# Patient Record
Sex: Male | Born: 1945 | Race: White | Hispanic: No | Marital: Single | Smoking: Former smoker
Health system: Southern US, Community
[De-identification: ages and names within clinical notes are randomized; demographics above are authoritative.]

---

## 2018-04-13 ENCOUNTER — Emergency Department (HOSPITAL_COMMUNITY)
Admission: EM | Admit: 2018-04-13 | Discharge: 2018-04-13 | Disposition: A | Discharge status: Home / Self Care | Payer: Medicare Other | Attending: Emergency Medicine | Admitting: Emergency Medicine

## 2018-04-13 ENCOUNTER — Other Ambulatory Visit: Payer: Self-pay

## 2018-04-13 ENCOUNTER — Encounter (HOSPITAL_COMMUNITY): Payer: Self-pay | Admitting: Emergency Medicine

## 2018-04-13 ENCOUNTER — Emergency Department (HOSPITAL_COMMUNITY): Payer: Medicare Other

## 2018-04-13 DIAGNOSIS — Z7982 Long term (current) use of aspirin: Secondary | ICD-10-CM | POA: Diagnosis not present

## 2018-04-13 DIAGNOSIS — R109 Unspecified abdominal pain: Secondary | ICD-10-CM | POA: Diagnosis not present

## 2018-04-13 DIAGNOSIS — Z87891 Personal history of nicotine dependence: Secondary | ICD-10-CM | POA: Diagnosis not present

## 2018-04-13 DIAGNOSIS — Z79899 Other long term (current) drug therapy: Secondary | ICD-10-CM | POA: Diagnosis not present

## 2018-04-13 DIAGNOSIS — R197 Diarrhea, unspecified: Secondary | ICD-10-CM | POA: Insufficient documentation

## 2018-04-13 DIAGNOSIS — E860 Dehydration: Secondary | ICD-10-CM

## 2018-04-13 DIAGNOSIS — Z7902 Long term (current) use of antithrombotics/antiplatelets: Secondary | ICD-10-CM | POA: Diagnosis not present

## 2018-04-13 DIAGNOSIS — R112 Nausea with vomiting, unspecified: Secondary | ICD-10-CM

## 2018-04-13 LAB — URINALYSIS, ROUTINE W REFLEX MICROSCOPIC
BILIRUBIN URINE: NEGATIVE
Glucose, UA: NEGATIVE mg/dL
Ketones, ur: NEGATIVE mg/dL
Leukocytes, UA: NEGATIVE
Nitrite: NEGATIVE
PH: 5 (ref 5.0–8.0)
Protein, ur: 30 mg/dL — AB
SPECIFIC GRAVITY, URINE: 1.021 (ref 1.005–1.030)

## 2018-04-13 LAB — COMPREHENSIVE METABOLIC PANEL
ALK PHOS: 51 U/L (ref 38–126)
ALT: 14 U/L (ref 0–44)
AST: 16 U/L (ref 15–41)
Albumin: 3.9 g/dL (ref 3.5–5.0)
Anion gap: 12 (ref 5–15)
BUN: 20 mg/dL (ref 8–23)
CALCIUM: 9.1 mg/dL (ref 8.9–10.3)
CHLORIDE: 96 mmol/L — AB (ref 98–111)
CO2: 32 mmol/L (ref 22–32)
Creatinine, Ser: 0.94 mg/dL (ref 0.61–1.24)
Glucose, Bld: 111 mg/dL — ABNORMAL HIGH (ref 70–99)
Potassium: 3.6 mmol/L (ref 3.5–5.1)
SODIUM: 140 mmol/L (ref 135–145)
Total Bilirubin: 1.2 mg/dL (ref 0.3–1.2)
Total Protein: 6.6 g/dL (ref 6.5–8.1)

## 2018-04-13 LAB — CBC
HCT: 46.4 % (ref 39.0–52.0)
Hemoglobin: 14.5 g/dL (ref 13.0–17.0)
MCH: 28.3 pg (ref 26.0–34.0)
MCHC: 31.3 g/dL (ref 30.0–36.0)
MCV: 90.6 fL (ref 78.0–100.0)
PLATELETS: 142 10*3/uL — AB (ref 150–400)
RBC: 5.12 MIL/uL (ref 4.22–5.81)
RDW: 15.5 % (ref 11.5–15.5)
WBC: 9 10*3/uL (ref 4.0–10.5)

## 2018-04-13 LAB — LIPASE, BLOOD: LIPASE: 26 U/L (ref 11–51)

## 2018-04-13 MED ORDER — SODIUM CHLORIDE 0.9 % IV BOLUS
1000.0000 mL | Freq: Once | INTRAVENOUS | Status: AC
  Administered 2018-04-13: 1000 mL via INTRAVENOUS

## 2018-04-13 MED ORDER — LOPERAMIDE HCL 2 MG PO CAPS: 2.0000 mg | ORAL_CAPSULE | Freq: Four times a day (QID) | ORAL | 0 refills | Status: AC | PRN

## 2018-04-13 MED ORDER — SODIUM CHLORIDE 0.9 % IV BOLUS: 1000.0000 mL | Freq: Once | INTRAVENOUS | Status: DC

## 2018-04-13 MED ORDER — DICYCLOMINE HCL 20 MG PO TABS: 20.0000 mg | ORAL_TABLET | Freq: Three times a day (TID) | ORAL | 0 refills | Status: AC

## 2018-04-13 MED ORDER — ONDANSETRON 4 MG PO TBDP: 4.0000 mg | ORAL_TABLET | Freq: Three times a day (TID) | ORAL | 0 refills | Status: AC | PRN

## 2018-04-13 MED ORDER — MORPHINE SULFATE (PF) 4 MG/ML IV SOLN
4.0000 mg | Freq: Once | INTRAVENOUS | Status: DC
  Filled 2018-04-13: qty 1

## 2018-04-13 MED ORDER — IOHEXOL 300 MG/ML  SOLN
100.0000 mL | Freq: Once | INTRAMUSCULAR | Status: AC | PRN
  Administered 2018-04-13: 100 mL via INTRAVENOUS

## 2018-04-13 MED ORDER — ONDANSETRON HCL 4 MG/2ML IJ SOLN
4.0000 mg | Freq: Once | INTRAMUSCULAR | Status: AC
  Administered 2018-04-13: 4 mg via INTRAVENOUS
  Filled 2018-04-13: qty 2

## 2018-04-13 MED ORDER — DICYCLOMINE HCL 10 MG PO CAPS
20.0000 mg | ORAL_CAPSULE | Freq: Once | ORAL | Status: AC
  Administered 2018-04-13: 20 mg via ORAL
  Filled 2018-04-13: qty 2

## 2018-04-13 NOTE — Discharge Instructions (Addendum)
For your diarrhea: Take the meds as prescribed.  I would HOLD/STOP TAKING your hydrochlorothiazide for up to 5 days, until your symptoms improve.  Drink at least 6-8 glasses of water daily.  If your symptoms do not improve or if you develop worsening pain, fevers, or other concerning symptoms, return to the ER  Your CT showed several incidental findings - make sure to discuss your adrenal glands, kidneys, prostate with your new doctor in [place].

## 2018-04-13 NOTE — ED Notes (Signed)
Pt provided with graham crackers and beverage by EMT for PO challenge

## 2018-04-13 NOTE — ED Notes (Signed)
Patient up to bathroom for stool sample attempt.

## 2018-04-13 NOTE — ED Provider Notes (Signed)
[facility] EMERGENCY DEPARTMENT Provider Note   CSN: [id number] Arrival date & time: 04/13/18  0400     History   Chief Complaint Chief Complaint  Patient presents with  . Emesis  . Diarrhea    HPI Mark Favaro Sr. is a 72 y.o. male.  HPI  72 year old male here with nausea, vomiting, and abdominal pain.  The patient has an extensive history of intra-abdominal surgeries.  He just had a right inguinal hernia repair complicated by small bowel obstruction in August at [facility].  He states that since then, he has had intermittent abdominal pain.  Over the last 2 days, the patient has had progressively worsening, severe, diffuse abdominal pain with nausea, vomiting, and diarrhea.  The patient has had diffuse, intermittent, cramp-like abdominal pain with this.  He has been unable to eat or drink due to this pain.  Denies any urinary symptoms.  Denies any flank pain.  His vomiting has been yellow and green.  His stool has been loose and nonbloody.  History reviewed. No pertinent past medical history.  There are no active problems to display for this patient.   History reviewed. No pertinent surgical history.      Home Medications    Prior to Admission medications   Medication Sig Start Date End Date Taking? Authorizing Provider  amLODipine (NORVASC) 5 MG tablet Take 5 mg by mouth daily.   Yes [provider]  aspirin EC 81 MG tablet Take 81 mg by mouth daily.   Yes [provider]  hydrochlorothiazide (HYDRODIURIL) 25 MG tablet Take 25 mg by mouth daily.   Yes [provider]  lisinopril (PRINIVIL,ZESTRIL) 40 MG tablet Take 40 mg by mouth daily.   Yes [provider]  metoprolol tartrate (LOPRESSOR) 50 MG tablet Take 50 mg by mouth daily.   Yes [provider]  potassium chloride (KLOR-CON) 20 MEQ packet Take by mouth 3 (three) times daily.   Yes [provider]  pravastatin (PRAVACHOL) 40 MG tablet Take  40 mg by mouth daily.   Yes [provider]  dicyclomine (BENTYL) 20 MG tablet Take 1 tablet (20 mg total) by mouth 3 (three) times daily before meals for 10 days. 04/13/18 04/23/18  Shaune Pollack, MD  loperamide (IMODIUM) 2 MG capsule Take 1 capsule (2 mg total) by mouth 4 (four) times daily as needed for diarrhea or loose stools. 04/13/18   Shaune Pollack, MD  ondansetron (ZOFRAN ODT) 4 MG disintegrating tablet Take 1 tablet (4 mg total) by mouth every 8 (eight) hours as needed for nausea or vomiting. 04/13/18   Shaune Pollack, MD    Family History No family history on file.  Social History Social History   Tobacco Use  . Smoking status: Former Games developer  . Smokeless tobacco: Never Used  . Tobacco comment: pt stopped smoking 2 weeks ago  Substance Use Topics  . Alcohol use: Not Currently    Frequency: Never  . Drug use: Never     Allergies   Patient has no known allergies.   Review of Systems Review of Systems  Constitutional: Positive for fatigue. Negative for chills and fever.  HENT: Negative for congestion and rhinorrhea.   Eyes: Negative for visual disturbance.  Respiratory: Negative for cough, shortness of breath and wheezing.   Cardiovascular: Negative for chest pain and leg swelling.  Gastrointestinal: Positive for abdominal pain, diarrhea, nausea and vomiting.  Genitourinary: Negative for dysuria and flank pain.  Musculoskeletal: Negative for neck pain  and neck stiffness.  Skin: Negative for rash and wound.  Allergic/Immunologic: Negative for immunocompromised state.  Neurological: Positive for weakness. Negative for syncope and headaches.  All other systems reviewed and are negative.    Physical Exam Updated Vital Signs BP (!) 149/96   Pulse 68   Temp 98.2 F (36.8 C) (Oral)   Resp 16   Ht 6\' 1"  (1.854 m)   Wt 53.5 kg   SpO2 95%   BMI 15.57 kg/m   Physical Exam  Constitutional: He is oriented to person, place, and time. He appears  well-developed and well-nourished. No distress.  HENT:  Head: Normocephalic and atraumatic.  Dry mucous membranes  Eyes: Conjunctivae are normal.  Neck: Neck supple.  Cardiovascular: Normal rate, regular rhythm and normal heart sounds. Exam reveals no friction rub.  No murmur heard. Pulmonary/Chest: Effort normal and breath sounds normal. No respiratory distress. He has no wheezes. He has no rales.  Abdominal: Soft. He exhibits no distension. There is tenderness. There is guarding. There is no rebound.  Surgical site clean, dry, and intact.  No appreciable large inguinal hernia.  Midline incision.  Diffuse ecchymoses.  Musculoskeletal: He exhibits no edema.  Neurological: He is alert and oriented to person, place, and time. He exhibits normal muscle tone.  Skin: Skin is warm. Capillary refill takes less than 2 seconds.  Psychiatric: He has a normal mood and affect.  Nursing note and vitals reviewed.    ED Treatments / Results  Labs (all labs ordered are listed, but only abnormal results are displayed) Labs Reviewed  COMPREHENSIVE METABOLIC PANEL - Abnormal; Notable for the following components:      Result Value   Chloride 96 (*)    Glucose, Bld 111 (*)    All other components within normal limits  CBC - Abnormal; Notable for the following components:   Platelets 142 (*)    All other components within normal limits  URINALYSIS, ROUTINE W REFLEX MICROSCOPIC - Abnormal; Notable for the following components:   Color, Urine AMBER (*)    APPearance HAZY (*)    Hgb urine dipstick SMALL (*)    Protein, ur 30 (*)    Bacteria, UA RARE (*)    All other components within normal limits  GASTROINTESTINAL PANEL BY PCR, STOOL (REPLACES STOOL CULTURE)  C DIFFICILE QUICK SCREEN W PCR REFLEX  URINE CULTURE  LIPASE, BLOOD    EKG None  Radiology Ct Abdomen Pelvis W Contrast  Result Date: 04/13/2018 CLINICAL DATA:  Pt c/o being sick for 10 days N/V?D says he has a hiatal hernia and  thinks it is the problem Hx of bowel obstruction EXAM: CT ABDOMEN AND PELVIS WITH CONTRAST TECHNIQUE: Multidetector CT imaging of the abdomen and pelvis was performed using the standard protocol following bolus administration of intravenous contrast. CONTRAST:  OMNIPAQUE IOHEXOL 300 MG/ML  SOLN COMPARISON:  None. FINDINGS: Lower chest: No acute findings. Healed granuloma, left upper lobe lingula. Scarring and/or subsegmental atelectasis, right lung base. Thoracic aortic stent extends across the aortic hiatus. Hepatobiliary: Liver normal in size. No mass or focal lesion. Gallbladder surgically absent. No bile duct dilation. Pancreas: Atrophic pancreas.  No mass or inflammation. Spleen: Normal in size. No mass or suspicious lesions. Few small calcified granuloma. Adrenals/Urinary Tract: Bilateral adrenal gland thickening consistent with hyperplasia. Kidneys normal in overall size and position. There are several small renal masses, a few hyperattenuating, consistent with a combination of simple and mildly complicated cysts. Largest arises from the posterior midpole  the right kidney measuring 16 mm. No other renal masses. No stones. No hydronephrosis. Ureters normal course and in caliber. Bladder is unremarkable. Stomach/Bowel: Small bowel anastomosis noted in the left pelvis. There is no bowel dilation to suggest obstruction. No bowel wall thickening or inflammation. Stomach is unremarkable. Vascular/Lymphatic: Ectatic aorta. Aorta is most dilated at the hiatus where it measures 4.8 x 4.7 cm transversely. And aortic stent extends to the aortic hiatus. There is atherosclerotic disease along the remainder of the aorta without significant stenosis. Branch vessels are widely patent. 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 No pathologically enlarged lymph nodes. Reproductive: Enlarged prostate measuring 5 x 3.8 x 4.8 cm.  Other: No abdominal wall hernia.  No ascites. Musculoskeletal: No fracture or acute finding. No osteoblastic or osteolytic lesions. IMPRESSION: 1. No acute findings within the abdomen or pelvis. No evidence of bowel obstruction or inflammation. 2. Multiple chronic abnormalities. Thoracic aortic aneurysm, possibly with a previous dissection, treated with an aortic stent. Aorta is dilated to 4.8 x 4.7 cm at its hiatus. Aortic atherosclerosis. Bilateral thickened adrenal glands consistent with hyperplasia. Renal masses consistent with simple and mildly complicated cysts. Images from previous small bowel surgery as well as a cholecystectomy. Prostatic hypertrophy. Electronically Signed   By: Amie Portland M.D.   On: 04/13/2018 12:34    Procedures Procedures (including critical care time)  Medications Ordered in ED Medications  morphine 4 MG/ML injection 4 mg (4 mg Intravenous Refused 04/13/18 1016)  sodium chloride 0.9 % bolus 1,000 mL (has no administration in time range)  sodium chloride 0.9 % bolus 1,000 mL (0 mLs Intravenous Stopped 04/13/18 1312)  ondansetron (ZOFRAN) injection 4 mg (4 mg Intravenous Given 04/13/18 1016)  iohexol (OMNIPAQUE) 300 MG/ML solution 100 mL (100 mLs Intravenous Contrast Given 04/13/18 1152)  dicyclomine (BENTYL) capsule 20 mg (20 mg Oral Given 04/13/18 1310)     Initial Impression / Assessment and Plan / ED Course  I have reviewed the triage vital signs and the nursing notes.  Pertinent labs & imaging results that were available during my care of the patient were reviewed by me and considered in my medical decision making (see chart for details).     72 year old male here with nausea, vomiting, and diarrhea.  Patient has a history of recent inguinal hernia repair as well as small bowel resection.  Clinically, the patient is mildly dehydrated.  Lab work, however, is very reassuring.  His urinalysis is without UTI.  He has some mild hematuria that he can follow-up as an  outpatient.  CBC and CMP are unremarkable.  CT scan shows no acute abnormality.  Following symptomatic control, patient feels markedly improved and is able to eat and drink without difficulty.  Discussed stool studies, and patient is unable to provide a stool here.  Given his normal white count and reassuring CT scan, do not feel C. difficile or significant infectious colitis is likely.  Discussed management options with the patient and family.  Given his well appearance and stable labs, will treat symptomatically at this time, discharged with close outpatient follow-up.  Good return precautions discussed.  Final Clinical Impressions(s) / ED Diagnoses   Final diagnoses:  Nausea vomiting and diarrhea  Dehydration    ED Discharge Orders         Ordered    ondansetron (ZOFRAN ODT) 4 MG disintegrating tablet  Every 8 hours PRN  04/13/18 1610    dicyclomine (BENTYL) 20 MG tablet  3 times daily before meals     04/13/18 1610    loperamide (IMODIUM) 2 MG capsule  4 times daily PRN     04/13/18 1610           Shaune PollackIsaacs, [place], MD 04/13/18 1627

## 2018-04-13 NOTE — ED Triage Notes (Signed)
Per pt has had n/v/d x 2 days. [place] About 2 months ago had hernia sx and sbo surgery that caused bowel resection.  Hx of aortic surgery 7 years ago.

## 2018-04-13 NOTE — ED Notes (Signed)
Patient transported to CT 

## 2018-04-14 ENCOUNTER — Encounter (HOSPITAL_COMMUNITY): Payer: Self-pay | Admitting: Emergency Medicine

## 2018-04-14 ENCOUNTER — Emergency Department (HOSPITAL_COMMUNITY)
Admission: EM | Admit: 2018-04-14 | Discharge: 2018-04-14 | Disposition: A | Discharge status: Home / Self Care | Payer: Medicare Other | Attending: Emergency Medicine | Admitting: Emergency Medicine

## 2018-04-14 DIAGNOSIS — R197 Diarrhea, unspecified: Secondary | ICD-10-CM | POA: Diagnosis not present

## 2018-04-14 DIAGNOSIS — R112 Nausea with vomiting, unspecified: Secondary | ICD-10-CM | POA: Diagnosis not present

## 2018-04-14 DIAGNOSIS — Z79899 Other long term (current) drug therapy: Secondary | ICD-10-CM | POA: Insufficient documentation

## 2018-04-14 DIAGNOSIS — Z7982 Long term (current) use of aspirin: Secondary | ICD-10-CM | POA: Insufficient documentation

## 2018-04-14 DIAGNOSIS — Z87891 Personal history of nicotine dependence: Secondary | ICD-10-CM | POA: Diagnosis not present

## 2018-04-14 LAB — CBC WITH DIFFERENTIAL/PLATELET
Abs Immature Granulocytes: 0.1 10*3/uL (ref 0.0–0.1)
Basophils Absolute: 0 10*3/uL (ref 0.0–0.1)
Basophils Relative: 0 %
Eosinophils Absolute: 0 10*3/uL (ref 0.0–0.7)
Eosinophils Relative: 0 %
HEMATOCRIT: 44.7 % (ref 39.0–52.0)
HEMOGLOBIN: 13.9 g/dL (ref 13.0–17.0)
IMMATURE GRANULOCYTES: 1 %
LYMPHS ABS: 1.1 10*3/uL (ref 0.7–4.0)
LYMPHS PCT: 15 %
MCH: 28.4 pg (ref 26.0–34.0)
MCHC: 31.1 g/dL (ref 30.0–36.0)
MCV: 91.4 fL (ref 78.0–100.0)
MONO ABS: 0.4 10*3/uL (ref 0.1–1.0)
MONOS PCT: 6 %
NEUTROS ABS: 5.3 10*3/uL (ref 1.7–7.7)
Neutrophils Relative %: 78 %
Platelets: 128 10*3/uL — ABNORMAL LOW (ref 150–400)
RBC: 4.89 MIL/uL (ref 4.22–5.81)
RDW: 15.9 % — ABNORMAL HIGH (ref 11.5–15.5)
WBC: 6.9 10*3/uL (ref 4.0–10.5)

## 2018-04-14 LAB — COMPREHENSIVE METABOLIC PANEL
ALBUMIN: 3.6 g/dL (ref 3.5–5.0)
ALT: 28 U/L (ref 0–44)
ANION GAP: 11 (ref 5–15)
AST: 37 U/L (ref 15–41)
Alkaline Phosphatase: 60 U/L (ref 38–126)
BILIRUBIN TOTAL: 1.2 mg/dL (ref 0.3–1.2)
BUN: 19 mg/dL (ref 8–23)
CHLORIDE: 98 mmol/L (ref 98–111)
CO2: 30 mmol/L (ref 22–32)
Calcium: 8.5 mg/dL — ABNORMAL LOW (ref 8.9–10.3)
Creatinine, Ser: 1.1 mg/dL (ref 0.61–1.24)
GFR calc Af Amer: >60 mL/min (ref >60)
GFR calc non Af Amer: >60 mL/min (ref >60)
GLUCOSE: 112 mg/dL — AB (ref 70–99)
POTASSIUM: 2.9 mmol/L — AB (ref 3.5–5.1)
Sodium: 139 mmol/L (ref 135–145)
TOTAL PROTEIN: 6.3 g/dL — AB (ref 6.5–8.1)

## 2018-04-14 LAB — LIPASE, BLOOD: Lipase: 23 U/L (ref 11–51)

## 2018-04-14 LAB — TROPONIN I: Troponin I: <0.03 ng/mL (ref <0.03)

## 2018-04-14 MED ORDER — POTASSIUM CHLORIDE CRYS ER 20 MEQ PO TBCR
40.0000 meq | EXTENDED_RELEASE_TABLET | Freq: Once | ORAL | Status: AC
  Administered 2018-04-14: 40 meq via ORAL
  Filled 2018-04-14: qty 2

## 2018-04-14 MED ORDER — ONDANSETRON HCL 4 MG/2ML IJ SOLN
4.0000 mg | Freq: Once | INTRAMUSCULAR | Status: AC
  Administered 2018-04-14: 4 mg via INTRAVENOUS
  Filled 2018-04-14: qty 2

## 2018-04-14 MED ORDER — OMEPRAZOLE 20 MG PO CPDR: 20.0000 mg | DELAYED_RELEASE_CAPSULE | Freq: Every day | ORAL | 0 refills | Status: AC

## 2018-04-14 MED ORDER — FAMOTIDINE IN NACL 20-0.9 MG/50ML-% IV SOLN
20.0000 mg | Freq: Once | INTRAVENOUS | Status: AC
  Administered 2018-04-14: 20 mg via INTRAVENOUS
  Filled 2018-04-14: qty 50

## 2018-04-14 MED ORDER — SODIUM CHLORIDE 0.9 % IV SOLN
INTRAVENOUS | Status: DC
  Administered 2018-04-14: 10:00:00 via INTRAVENOUS

## 2018-04-14 MED ORDER — SODIUM CHLORIDE 0.9 % IV BOLUS
1000.0000 mL | Freq: Once | INTRAVENOUS | Status: AC
  Administered 2018-04-14: 1000 mL via INTRAVENOUS

## 2018-04-14 MED ORDER — POTASSIUM CHLORIDE 10 MEQ/100ML IV SOLN
10.0000 meq | Freq: Once | INTRAVENOUS | Status: AC
  Administered 2018-04-14: 10 meq via INTRAVENOUS
  Filled 2018-04-14: qty 100

## 2018-04-14 NOTE — ED Triage Notes (Signed)
Pt arrives via EMS with complaints of n/v/d and weakness. Pt seen here with same yesterday and sent home zofran but told to return if no improvement. Pt last vomited yesterday. Family reports intermittent confusion. Pt lives with son and daughter in law. 250 cc NS given

## 2018-04-14 NOTE — ED Provider Notes (Signed)
[facility] EMERGENCY DEPARTMENT Provider Note   CSN: [id number] Arrival date & time: 04/14/18  4098     History   Chief Complaint Chief Complaint  Patient presents with  . Nausea  . Emesis    HPI Mark Delpriore Sr. is a 72 y.o. male.  HPI Patient presents to the emergency room for evaluation of persistent nausea vomiting and diarrhea.  Patient initially presented to the emergency room yesterday with complaints of nausea vomiting abdominal pain and diarrhea that started a couple days ago.  At times the patient was also having diffuse abdominal pain.  Patient recently moved from the [place] 2 weeks ago to be closer to his son.  Patient had an extensive evaluation yesterday including laboratory tests and a CT scan.  Patient was given a prescription for Zofran.  Patient states he took that yesterday but does not think it helped much.  He still has nausea and vomited twice since leaving the hospital.  He has also had a couple of loose stools since leaving the hospital.  Because the patient's not feeling any better he came back to the emergency room for reevaluation.  Patient son was able to provide some additional history.  He states the patient continued to have some difficulty with nausea and dry heaves after he left the hospital.  He took the prescribed medications but they did not help.  Patient describes a burning type discomfort associated with belching before some of these episodes. History reviewed. No pertinent past medical history.  There are no active problems to display for this patient.   History reviewed. No pertinent surgical history.      Home Medications    Prior to Admission medications   Medication Sig Start Date End Date Taking? Authorizing Provider  amLODipine (NORVASC) 5 MG tablet Take 5 mg by mouth daily.   Yes [provider]  aspirin EC 81 MG tablet Take 81 mg by mouth daily.   Yes [provider]    lisinopril (PRINIVIL,ZESTRIL) 40 MG tablet Take 40 mg by mouth daily.   Yes [provider]  metoprolol tartrate (LOPRESSOR) 50 MG tablet Take 50 mg by mouth daily.   Yes [provider]  ondansetron (ZOFRAN ODT) 4 MG disintegrating tablet Take 1 tablet (4 mg total) by mouth every 8 (eight) hours as needed for nausea or vomiting. 04/13/18  Yes Shaune Pollack, MD  potassium chloride (KLOR-CON) 20 MEQ packet Take 20 mEq by mouth 3 (three) times daily.    Yes [provider]  pravastatin (PRAVACHOL) 40 MG tablet Take 40 mg by mouth daily.   Yes [provider]  dicyclomine (BENTYL) 20 MG tablet Take 1 tablet (20 mg total) by mouth 3 (three) times daily before meals for 10 days. 04/13/18 04/23/18  Shaune Pollack, MD  loperamide (IMODIUM) 2 MG capsule Take 1 capsule (2 mg total) by mouth 4 (four) times daily as needed for diarrhea or loose stools. 04/13/18   Shaune Pollack, MD  omeprazole (PRILOSEC) 20 MG capsule Take 1 capsule (20 mg total) by mouth daily. 04/14/18   Linwood Dibbles, MD    Family History No family history on file.  Social History Social History   Tobacco Use  . Smoking status: Former Games developer  . Smokeless tobacco: Never Used  . Tobacco comment: pt stopped smoking 2 weeks ago  Substance Use Topics  . Alcohol use: Not Currently    Frequency: Never  . Drug use: Never  Allergies   Patient has no known allergies.   Review of Systems Review of Systems  Psychiatric/Behavioral:       Seemed to be talking in his sleep last night  All other systems reviewed and are negative.    Physical Exam Updated Vital Signs BP (!) 145/98   Pulse 70   Temp 99.1 F (37.3 C) (Oral)   Resp 17   Ht 1.854 m (6\' 1" )   Wt 53.5 kg   SpO2 96%   BMI 15.57 kg/m   Physical Exam  Constitutional: No distress.  Thin, underweight  HENT:  Head: Normocephalic and atraumatic.  Right Ear: External ear normal.  Left Ear: External ear normal.  Eyes:  Conjunctivae are normal. Right eye exhibits no discharge. Left eye exhibits no discharge. No scleral icterus.  Neck: Neck supple. No tracheal deviation present.  Cardiovascular: Normal rate, regular rhythm and intact distal pulses.  Pulmonary/Chest: Effort normal and breath sounds normal. No stridor. No respiratory distress. He has no wheezes. He has no rales.  Abdominal: Soft. Bowel sounds are normal. He exhibits no distension. There is no tenderness. There is no rebound and no guarding.  Musculoskeletal: He exhibits no edema or tenderness.  Neurological: He is alert. He has normal strength. No cranial nerve deficit (no facial droop, extraocular movements intact, no slurred speech) or sensory deficit. He exhibits normal muscle tone. He displays no seizure activity. Coordination normal.  Skin: Skin is warm and dry. No rash noted.  Psychiatric: He has a normal mood and affect.  Nursing note and vitals reviewed.    ED Treatments / Results  Labs (all labs ordered are listed, but only abnormal results are displayed) Labs Reviewed  COMPREHENSIVE METABOLIC PANEL - Abnormal; Notable for the following components:      Result Value   Potassium 2.9 (*)    Glucose, Bld 112 (*)    Calcium 8.5 (*)    Total Protein 6.3 (*)    All other components within normal limits  CBC WITH DIFFERENTIAL/PLATELET - Abnormal; Notable for the following components:   RDW 15.9 (*)    Platelets 128 (*)    All other components within normal limits  GASTROINTESTINAL PANEL BY PCR, STOOL (REPLACES STOOL CULTURE)  LIPASE, BLOOD  TROPONIN I    EKG EKG Interpretation  Date/Time:  Sunday April 14 2018 10:28:41 EDT Ventricular Rate:  69 PR Interval:    QRS Duration: 143 QT Interval:  454 QTC Calculation: 487 R Axis:   -82 Text Interpretation:  Sinus rhythm RBBB and LAFB No old tracing to compare Confirmed by Linwood Dibbles [phone]) on 04/14/2018 10:33:17 AM   Radiology Ct Abdomen Pelvis W Contrast  Result Date:  04/13/2018 CLINICAL DATA:  Pt c/o being sick for 10 days N/V?D says he has a hiatal hernia and thinks it is the problem Hx of bowel obstruction EXAM: CT ABDOMEN AND PELVIS WITH CONTRAST TECHNIQUE: Multidetector CT imaging of the abdomen and pelvis was performed using the standard protocol following bolus administration of intravenous contrast. CONTRAST:  OMNIPAQUE IOHEXOL 300 MG/ML  SOLN COMPARISON:  None. FINDINGS: Lower chest: No acute findings. Healed granuloma, left upper lobe lingula. Scarring and/or subsegmental atelectasis, right lung base. Thoracic aortic stent extends across the aortic hiatus. Hepatobiliary: Liver normal in size. No mass or focal lesion. Gallbladder surgically absent. No bile duct dilation. Pancreas: Atrophic pancreas.  No mass or inflammation. Spleen: Normal in size. No mass or suspicious lesions. Few small calcified granuloma. Adrenals/Urinary Tract: Bilateral adrenal gland  thickening consistent with hyperplasia. Kidneys normal in overall size and position. There are several small renal masses, a few hyperattenuating, consistent with a combination of simple and mildly complicated cysts. Largest arises from the posterior midpole the right kidney measuring 16 mm. No other renal masses. No stones. No hydronephrosis. Ureters normal course and in caliber. Bladder is unremarkable. Stomach/Bowel: Small bowel anastomosis noted in the left pelvis. There is no bowel dilation to suggest obstruction. No bowel wall thickening or inflammation. Stomach is unremarkable. Vascular/Lymphatic: Ectatic aorta. Aorta is most dilated at the hiatus where it measures 4.8 x 4.7 cm transversely. And aortic stent extends to the aortic hiatus. There is atherosclerotic disease along the remainder of the aorta without significant stenosis. Branch vessels are widely patent. 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 No  pathologically enlarged lymph nodes. Reproductive: Enlarged prostate measuring 5 x 3.8 x 4.8 cm. Other: No abdominal wall hernia.  No ascites. Musculoskeletal: No fracture or acute finding. No osteoblastic or osteolytic lesions. IMPRESSION: 1. No acute findings within the abdomen or pelvis. No evidence of bowel obstruction or inflammation. 2. Multiple chronic abnormalities. Thoracic aortic aneurysm, possibly with a previous dissection, treated with an aortic stent. Aorta is dilated to 4.8 x 4.7 cm at its hiatus. Aortic atherosclerosis. Bilateral thickened adrenal glands consistent with hyperplasia. Renal masses consistent with simple and mildly complicated cysts. Images from previous small bowel surgery as well as a cholecystectomy. Prostatic hypertrophy. Electronically Signed   By: Amie Portlandavid  Ormond M.D.   On: 04/13/2018 12:34    Procedures Procedures (including critical care time)  Medications Ordered in ED Medications  sodium chloride 0.9 % bolus 1,000 mL (0 mLs Intravenous Stopped 04/14/18 0954)    And  0.9 %  sodium chloride infusion ( Intravenous New Bag/Given 04/14/18 0954)  ondansetron (ZOFRAN) injection 4 mg (4 mg Intravenous Given 04/14/18 0856)  famotidine (PEPCID) IVPB 20 mg premix (0 mg Intravenous Stopped 04/14/18 1109)  potassium chloride SA (K-DUR,KLOR-CON) CR tablet 40 mEq (40 mEq Oral Given 04/14/18 1040)  potassium chloride 10 mEq in 100 mL IVPB (0 mEq Intravenous Stopped 04/14/18 1207)     Initial Impression / Assessment and Plan / ED Course  I have reviewed the triage vital signs and the nursing notes.  Pertinent labs & imaging results that were available during my care of the patient were reviewed by me and considered in my medical decision making (see chart for details).  Clinical Course as of Apr 15 1511  Sun Apr 14, 2018  0841 Ua reviewed from yesterday's visit.  Will not repeat today.   [JK]    Clinical Course User Index [JK] Linwood DibblesKnapp, Stevan Eberwein, MD     presented to the  emergency room for evaluation of recurrent nausea and vomiting.  Patient was monitored in the emergency room.  He had no further episodes of vomiting or diarrhea.  His laboratory tests were notable for hypokalemia but that was treated here in the emergency room.  Patient was monitored for several hours.  He is feeling better.  He was able to eat a meal.  It is possible he may be having some acid reflux symptoms based on his history.  I will have him take Prilosec and I recommend follow-up with a primary care doctor and/or gastroenterologist.  At this time there does not appear to be any evidence of an acute emergency medical condition and the patient appears  stable for discharge with appropriate outpatient follow up.   Final Clinical Impressions(s) / ED Diagnoses   Final diagnoses:  Nausea vomiting and diarrhea    ED Discharge Orders         Ordered    omeprazole (PRILOSEC) 20 MG capsule  Daily     04/14/18 1511           Linwood Dibbles, MD 04/14/18 1512

## 2018-04-14 NOTE — ED Notes (Signed)
Discharge paperwork reviewed with pt and son. Follow- up care and prescriptions reviewed.

## 2018-04-14 NOTE — Discharge Instructions (Signed)
Follow-up with a primary care doctor.  Consider seeing a GI doctor.  Take the antacid as prescribed

## 2018-04-16 LAB — URINE CULTURE

## 2019-03-25 DEATH — deceased

## 2019-07-13 IMAGING — CT CT ABD-PELV W/ CM
2 of 5 series · 15 of 46 positions shown, 17 images · IV contrast (omnipaque)
Comparison: None.

CLINICAL DATA: Pt c/o being sick for 10 days N/V?D says he has a
hiatal hernia and thinks it is the problem Hx of bowel obstruction

EXAM:
CT ABDOMEN AND PELVIS WITH CONTRAST
TECHNIQUE: Multidetector CT imaging of the abdomen and pelvis was performed
using the standard protocol following bolus administration of
intravenous contrast.
CONTRAST:  100mL OMNIPAQUE IOHEXOL 300 MG/ML  SOLN

[Series 3: a/p w/ 5mm · axial · 0.66mm/px · z∈[+734,+1159]mm · 12 of 95 slices shown, 14 images]
[im 5/95  soft-tissue]
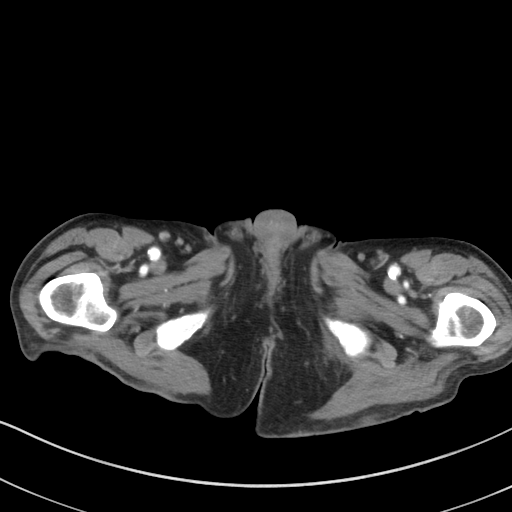
[im 5/95  bone]
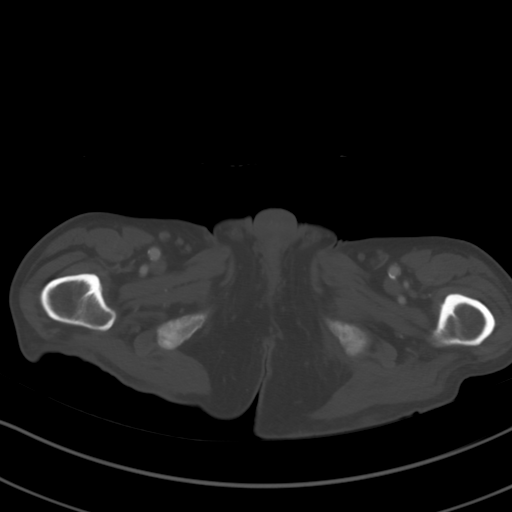
[im 15/95  soft-tissue]
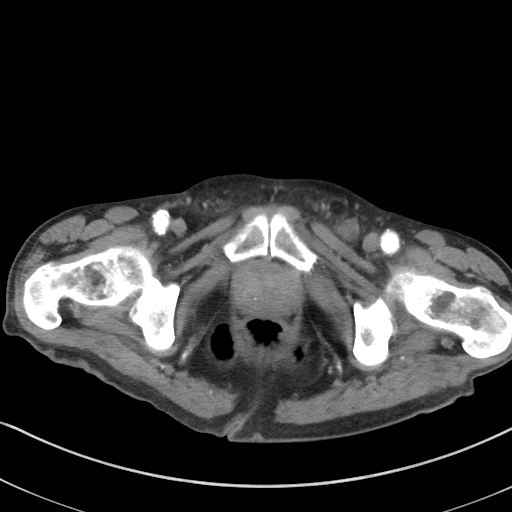
[im 20/95  soft-tissue]
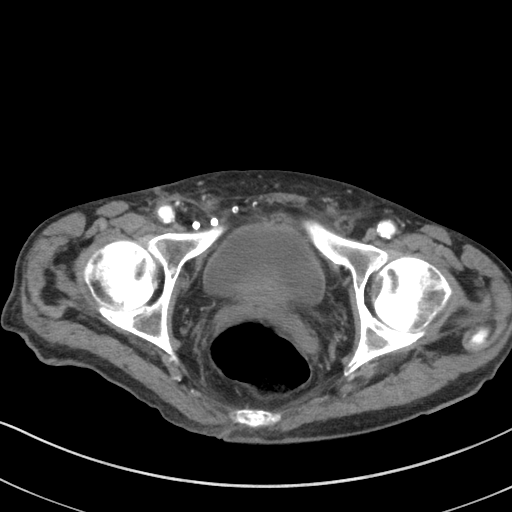
[im 30/95  soft-tissue]
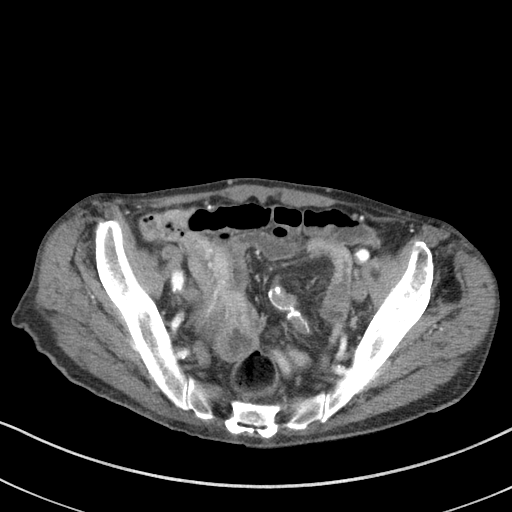
[im 35/95  soft-tissue]
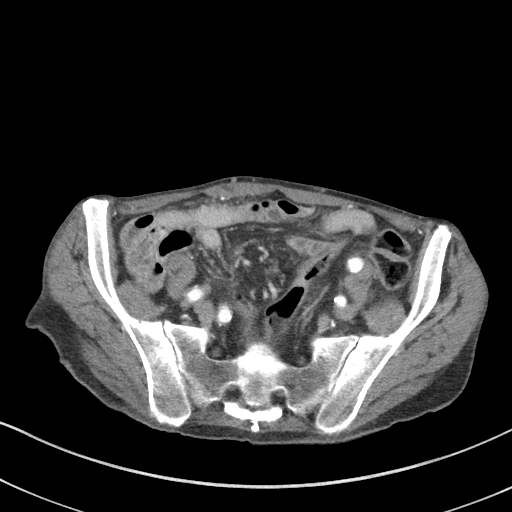
[im 45/95  soft-tissue]
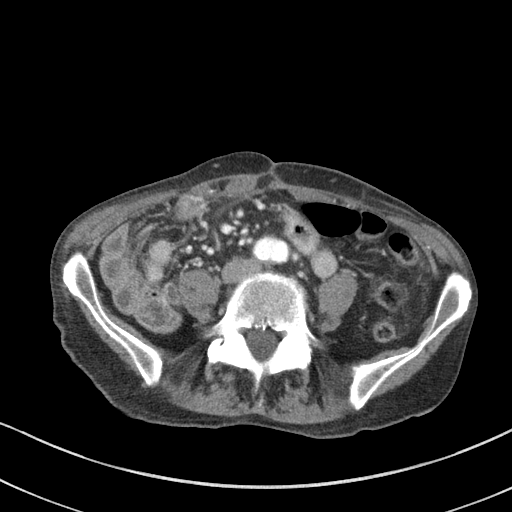
[im 50/95  soft-tissue]
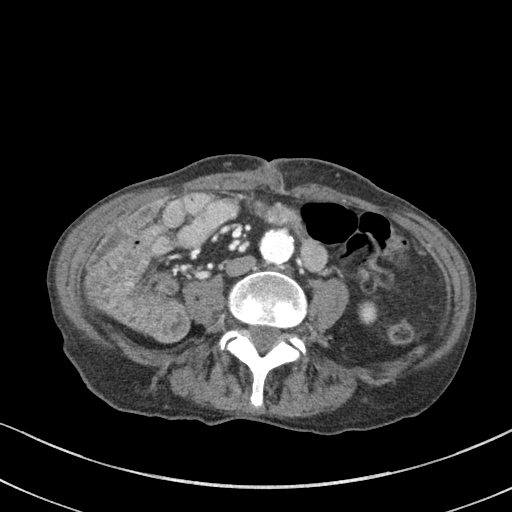
[im 60/95  soft-tissue]
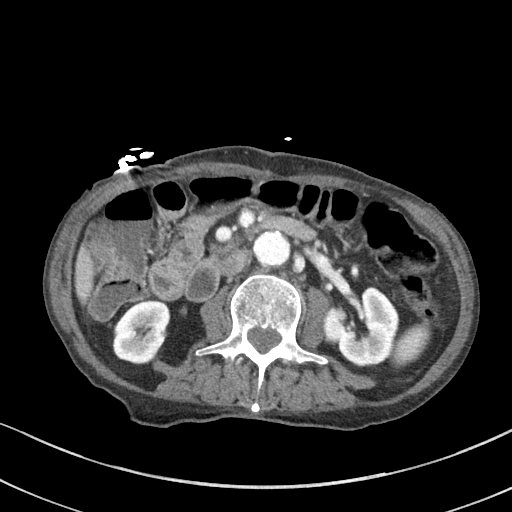
[im 65/95  soft-tissue]
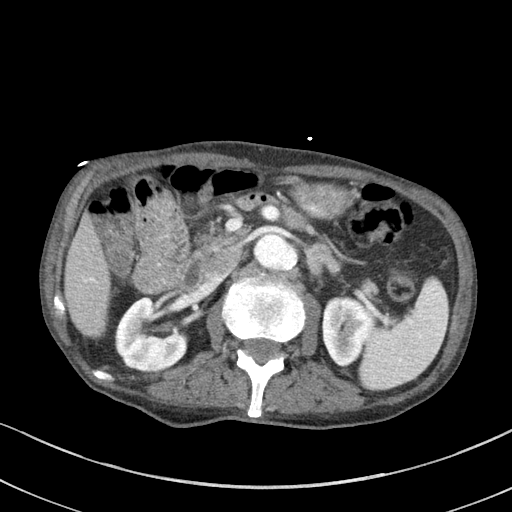
[im 65/95  bone]
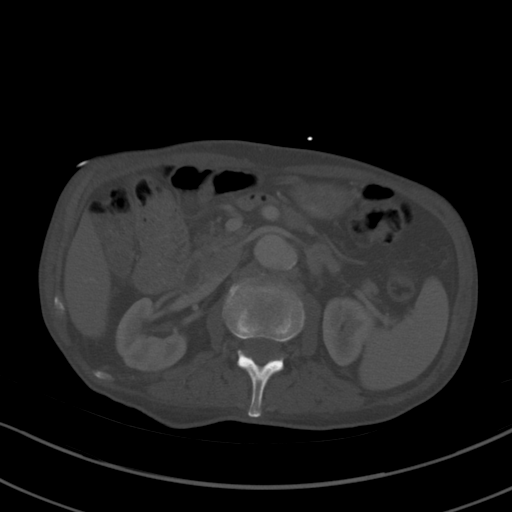
[im 75/95  soft-tissue]
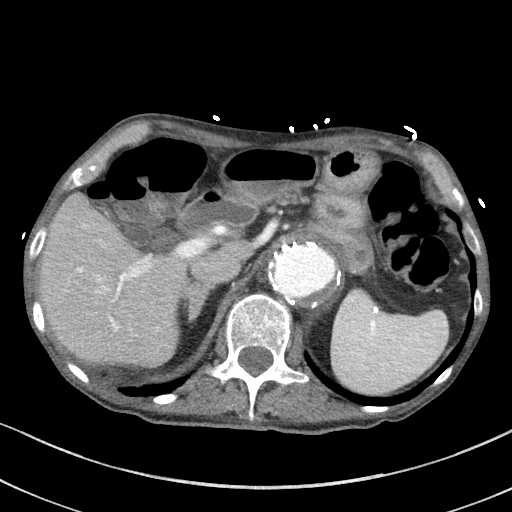
[im 80/95  soft-tissue]
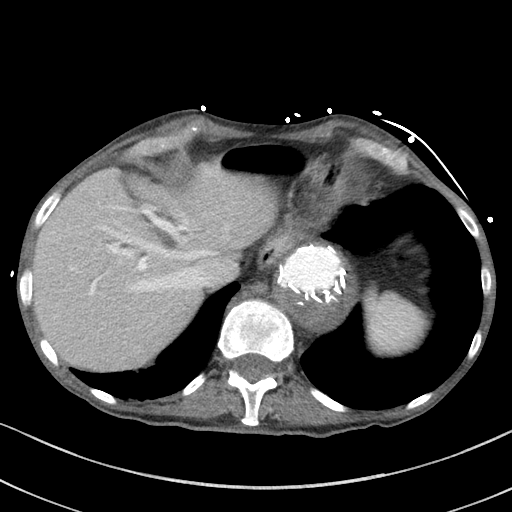
[im 90/95  soft-tissue]
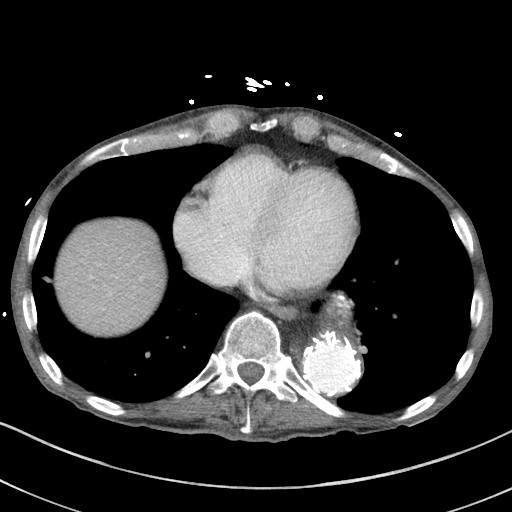

[Series 6: a/p w/ cor · coronal · 0.66mm/px · 3 of 149 slices shown]
[im 66/149  soft-tissue]
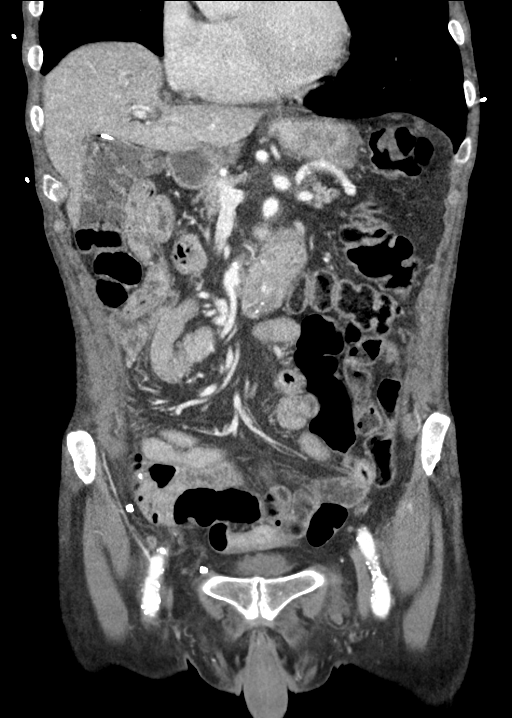
[im 83/149  soft-tissue]
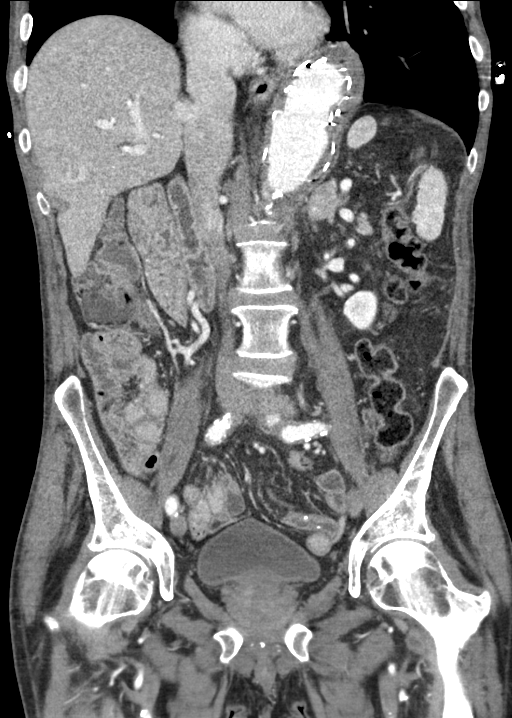
[im 99/149  soft-tissue]
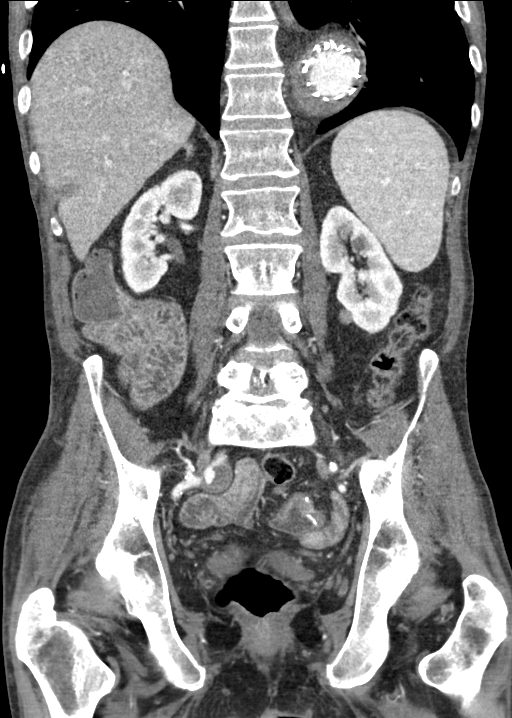

[15 of 46 positions shown; findings below may reference images not displayed]

FINDINGS: Lower chest: No acute findings. Healed granuloma, left upper lobe
lingula. Scarring and/or subsegmental atelectasis, right lung base.
Thoracic aortic stent extends across the aortic hiatus.

Hepatobiliary: Liver normal in size. No mass or focal lesion.
Gallbladder surgically absent. No bile duct dilation.

Pancreas: Atrophic pancreas.  No mass or inflammation.

Spleen: Normal in size. No mass or suspicious lesions. Few small
calcified granuloma.

Adrenals/Urinary Tract: Bilateral adrenal gland thickening
consistent with hyperplasia.

Kidneys normal in overall size and position. There are several small
renal masses, a few hyperattenuating, consistent with a combination
of simple and mildly complicated cysts. Largest arises from the
posterior midpole the right kidney measuring 16 mm. No other renal
masses. No stones. No hydronephrosis. Ureters normal course and in
caliber. Bladder is unremarkable.

Stomach/Bowel: Small bowel anastomosis noted in the left pelvis.
There is no bowel dilation to suggest obstruction. No bowel wall
thickening or inflammation. Stomach is unremarkable.

Vascular/Lymphatic: Ectatic aorta. Aorta is most dilated at the
hiatus where it measures 4.8 x 4.7 cm transversely. And aortic stent
extends to the aortic hiatus. There is atherosclerotic disease along
the remainder of the aorta without significant stenosis. Branch
vessels are widely patent. There is a 1.9 cm aneurysm of the right
internal iliac artery where there is stenosis of the lumen,
approximately 50-60% in severity.

No pathologically enlarged lymph nodes.

Reproductive: Enlarged prostate measuring 5 x 3.8 x 4.8 cm.

Other: No abdominal wall hernia.  No ascites.

Musculoskeletal: No fracture or acute finding. No osteoblastic or
osteolytic lesions.
IMPRESSION: 1. No acute findings within the abdomen or pelvis. No evidence of
bowel obstruction or inflammation.
2. Multiple chronic abnormalities. Thoracic aortic aneurysm,
possibly with a previous dissection, treated with an aortic stent.
Aorta is dilated to 4.8 x 4.7 cm at its hiatus. Aortic
atherosclerosis. Bilateral thickened adrenal glands consistent with
hyperplasia. Renal masses consistent with simple and mildly
complicated cysts. Images from previous small bowel surgery as well
as a cholecystectomy. Prostatic hypertrophy.
